# Patient Record
Sex: Male | Born: 2000 | Race: White | Hispanic: No | Marital: Single | Smoking: Never smoker
Health system: Southern US, Community
[De-identification: ages and names within clinical notes are randomized; demographics above are authoritative.]

## PROBLEM LIST (undated history)

## (undated) DIAGNOSIS — K219 Gastro-esophageal reflux disease without esophagitis: Secondary | ICD-10-CM

## (undated) DIAGNOSIS — J45909 Unspecified asthma, uncomplicated: Secondary | ICD-10-CM

---
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
## 2020-01-06 ENCOUNTER — Other Ambulatory Visit: Payer: Self-pay

## 2020-01-06 ENCOUNTER — Encounter: Payer: Self-pay | Admitting: Intensive Care

## 2020-01-06 ENCOUNTER — Emergency Department: Payer: Medicaid Other

## 2020-01-06 DIAGNOSIS — N50811 Right testicular pain: Secondary | ICD-10-CM | POA: Insufficient documentation

## 2020-01-06 DIAGNOSIS — Z5321 Procedure and treatment not carried out due to patient leaving prior to being seen by health care provider: Secondary | ICD-10-CM | POA: Insufficient documentation

## 2020-01-06 NOTE — ED Triage Notes (Signed)
Patient c/o intermittent testicle pain for months. Reports recently right side more painful, swollen, and seems "shorter." Denies trouble urinating

## 2020-01-07 ENCOUNTER — Emergency Department
Admission: EM | Admit: 2020-01-07 | Discharge: 2020-01-07 | Disposition: A | Discharge status: Home / Self Care | Payer: Medicaid Other | Attending: Emergency Medicine | Admitting: Emergency Medicine

## 2020-01-07 NOTE — ED Notes (Signed)
Called pt to re-check VS and collect labs. Unable to locate outside waiting room or in lobby.

## 2020-11-08 ENCOUNTER — Ambulatory Visit: Payer: Self-pay | Admitting: Urology

## 2021-04-12 ENCOUNTER — Ambulatory Visit
Admission: EM | Admit: 2021-04-12 | Discharge: 2021-04-12 | Disposition: A | Discharge status: Home / Self Care | Payer: Medicaid Other | Attending: Internal Medicine | Admitting: Internal Medicine

## 2021-04-12 ENCOUNTER — Other Ambulatory Visit: Payer: Self-pay

## 2021-04-12 DIAGNOSIS — R6889 Other general symptoms and signs: Secondary | ICD-10-CM | POA: Diagnosis not present

## 2021-04-12 NOTE — ED Triage Notes (Signed)
Pt here with C/O cough and chest congestion for 1 week. Was out with Flu like illness last week and needs to be cleared to go back to work.

## 2021-04-12 NOTE — ED Provider Notes (Signed)
[facility]    CSN: [id number] Arrival date & time: 04/12/21  1232      History   Chief Complaint Chief Complaint  Patient presents with   Cough    HPI Walter Fields is a 20 y.o. male comes to urgent care with 1 week history of chest congestion, nonproductive cough and nasal congestion.  Patient generalized body aches and a headache as well.  Patient has tried over-the-counter medications with no improvement in his symptoms.  He is coming to the urgent care for work note to return to work.Marland Kitchen   HPI  Past Medical History:  Diagnosis Date   Asthma in child    GERD (gastroesophageal reflux disease)     Patient Active Problem List   Diagnosis Date Noted   Appendicitis 04/11/2016   Acute appendicitis with localized peritonitis    Asthma 08/19/2013    Past Surgical History:  Procedure Laterality Date   LAPAROSCOPIC APPENDECTOMY N/A 04/11/2016   Procedure: APPENDECTOMY LAPAROSCOPIC;  Surgeon: Ricarda Frame, MD;  Location: [facility] ORS;  Service: General;  Laterality: N/A;       Home Medications    Prior to Admission medications   Medication Sig Start Date End Date Taking? Authorizing Provider  albuterol (VENTOLIN HFA) 108 (90 Base) MCG/ACT inhaler Inhale into the lungs. 01/19/10  Yes [provider]    Family History No family history on file.  Social History Social History   Tobacco Use   Smoking status: Never   Smokeless tobacco: Never  Vaping Use   Vaping Use: Every day  Substance Use Topics   Alcohol use: No   Drug use: No     Allergies   Patient has no known allergies.   Review of Systems Review of Systems As per HPI  Physical Exam Triage Vital Signs ED Triage Vitals  Enc Vitals Group     BP 04/12/21 1300 122/67     Pulse Rate 04/12/21 1300 87     Resp 04/12/21 1300 18     Temp 04/12/21 1300 99.1 F (37.3 C)     Temp Source 04/12/21 1300 Oral     SpO2 04/12/21 1300 98 %     Weight 04/12/21 1259 150 lb  (68 kg)     Height 04/12/21 1259 5\' 6"  (1.676 m)     Head Circumference --      Peak Flow --      Pain Score 04/12/21 1259 0     Pain Loc --      Pain Edu? --      Excl. in GC? --    No data found.  Updated Vital Signs BP 122/67 (BP Location: Right Arm)    Pulse 87    Temp 99.1 F (37.3 C) (Oral)    Resp 18    Ht 5\' 6"  (1.676 m)    Wt 68 kg    SpO2 98%    BMI 24.21 kg/m   Visual Acuity Right Eye Distance:   Left Eye Distance:   Bilateral Distance:    Right Eye Near:   Left Eye Near:    Bilateral Near:     Physical Exam Vitals and nursing note reviewed.  Constitutional:      General: He is not in acute distress.    Appearance: He is not ill-appearing.  Cardiovascular:     Rate and Rhythm: Normal rate and regular rhythm.     Pulses: Normal pulses.     Heart sounds: Normal heart sounds.  Pulmonary:     Effort: Pulmonary effort is normal.     Breath sounds: Normal breath sounds.  Neurological:     Mental Status: He is alert.     UC Treatments / Results  Labs (all labs ordered are listed, but only abnormal results are displayed) Labs Reviewed - No data to display  EKG   Radiology No results found.  Procedures Procedures (including critical care time)  Medications Ordered in UC Medications - No data to display  Initial Impression / Assessment and Plan / UC Course  I have reviewed the triage vital signs and the nursing notes.  Pertinent labs & imaging results that were available during my care of the patient were reviewed by me and considered in my medical decision making (see chart for details).     1.  Flulike symptoms-improved Patient is advised to maintain adequate hydration Continue over-the-counter remedies Return to urgent care if you have any other concerns Return to work note given. Final Clinical Impressions(s) / UC Diagnoses   Final diagnoses:  Flu-like symptoms     Discharge Instructions      Continue over-the-counter medication  for symptom management You may return to work today or tomorrow Return to urgent care if you have any other concerns.   ED Prescriptions   None    PDMP not reviewed this encounter.   Merrilee Jansky, MD 04/12/21 1318

## 2021-04-12 NOTE — Discharge Instructions (Addendum)
Continue over-the-counter medication for symptom management You may return to work today or tomorrow Return to urgent care if you have any other concerns.

## 2021-06-28 ENCOUNTER — Ambulatory Visit (INDEPENDENT_AMBULATORY_CARE_PROVIDER_SITE_OTHER): Payer: Medicaid Other

## 2021-06-28 ENCOUNTER — Ambulatory Visit
Admission: EM | Admit: 2021-06-28 | Discharge: 2021-06-28 | Disposition: A | Discharge status: Home / Self Care | Payer: Medicaid Other | Attending: Physician Assistant | Admitting: Physician Assistant

## 2021-06-28 ENCOUNTER — Other Ambulatory Visit: Payer: Self-pay

## 2021-06-28 DIAGNOSIS — M545 Low back pain, unspecified: Secondary | ICD-10-CM

## 2021-06-28 MED ORDER — DICLOFENAC SODIUM 75 MG PO TBEC: 75.0000 mg | DELAYED_RELEASE_TABLET | Freq: Two times a day (BID) | ORAL | 0 refills | Status: AC

## 2021-06-28 MED ORDER — CYCLOBENZAPRINE HCL 10 MG PO TABS: 10.0000 mg | ORAL_TABLET | Freq: Two times a day (BID) | ORAL | 0 refills | Status: AC | PRN

## 2021-06-28 NOTE — Discharge Instructions (Addendum)
BACK PAIN: x-rays are normal. You strained your back. Stressed avoiding painful activities . RICE (REST, ICE, COMPRESSION, ELEVATION) guidelines reviewed. May alternate ice and heat. Consider use of muscle rubs, Salonpas patches, etc. Use medications as directed including muscle relaxers if prescribed. Take anti-inflammatory medications as prescribed or OTC NSAIDs/Tylenol.  F/u with PCP in 7-10 days for reexamination, and please feel free to call or return to the urgent care at any time for any questions or concerns you may have and we will be happy to help you!  ? ?BACK PAIN RED FLAGS: If the back pain acutely worsens or there are any red flag symptoms such as numbness/tingling, leg weakness, saddle anesthesia, or loss of bowel/bladder control, go immediately to the ER. Follow up with [place] as scheduled or sooner if the pain does not begin to resolve or if it worsens before the follow up   ?

## 2021-06-28 NOTE — ED Provider Notes (Signed)
[facility]    CSN: [id number] Arrival date & time: 06/28/21  1127      History   Chief Complaint Chief Complaint  Patient presents with   Leg Pain   Back Pain    HPI Walter Fields is a 21 y.o. male presenting for diffuse aching lower back pain with radiation of pain into hips and thighs, especially the left hip and thigh for the past 2 days.  Pain is worse with forward flexion and prolonged sitting as well as lying down.  Denies any injury.  Has had similar symptoms in the past, about 6 months ago and then a few months before that.  Has never had any imaging.  No reports of numbness/tingling or weakness in the legs.  No reports of bowel or bladder dysfunction.  Patient says symptoms went away last time within 2 days.  He was apparently told he probably had a pinched nerve.  Patient does not recall taking any medication last time.  He has tried ibuprofen this time with the last dose sometime yesterday.  Has not taken anything for pain today.  Patient has no other complaints.  HPI  Past Medical History:  Diagnosis Date   Asthma in child    GERD (gastroesophageal reflux disease)     Patient Active Problem List   Diagnosis Date Noted   Appendicitis 04/11/2016   Acute appendicitis with localized peritonitis    Asthma 08/19/2013    Past Surgical History:  Procedure Laterality Date   LAPAROSCOPIC APPENDECTOMY N/A 04/11/2016   Procedure: APPENDECTOMY LAPAROSCOPIC;  Surgeon: Clayburn Pert, MD;  Location: [facility] ORS;  Service: General;  Laterality: N/A;       Home Medications    Prior to Admission medications   Medication Sig Start Date End Date Taking? Authorizing Provider  cyclobenzaprine (FLEXERIL) 10 MG tablet Take 1 tablet (10 mg total) by mouth 2 (two) times daily as needed for up to 7 days for muscle spasms. 06/28/21 07/05/21 Yes Danton Clap, PA-C  diclofenac (VOLTAREN) 75 MG EC tablet Take 1 tablet (75 mg total) by mouth 2 (two) times daily  for 10 days. 06/28/21 07/08/21 Yes Laurene Footman B, PA-C  albuterol (VENTOLIN HFA) 108 (90 Base) MCG/ACT inhaler Inhale into the lungs. 01/19/10   [provider]    Family History History reviewed. No pertinent family history.  Social History Social History   Tobacco Use   Smoking status: Never   Smokeless tobacco: Never  Vaping Use   Vaping Use: Every day  Substance Use Topics   Alcohol use: No   Drug use: No     Allergies   Patient has no known allergies.   Review of Systems Review of Systems  Constitutional:  Negative for fever.  Gastrointestinal:  Negative for abdominal pain.  Genitourinary:  Negative for flank pain.  Musculoskeletal:  Positive for back pain. Negative for gait problem and joint swelling.  Neurological:  Negative for weakness and numbness.    Physical Exam Triage Vital Signs ED Triage Vitals  Enc Vitals Group     BP 06/28/21 1139 (!) 108/56     Pulse Rate 06/28/21 1139 85     Resp --      Temp 06/28/21 1139 98.2 F (36.8 C)     Temp Source 06/28/21 1139 Oral     SpO2 06/28/21 1139 97 %     Weight 06/28/21 1135 155 lb (70.3 kg)     Height 06/28/21 1135 5' 6.5" (1.689 m)  Head Circumference --      Peak Flow --      Pain Score 06/28/21 1135 10     Pain Loc --      Pain Edu? --      Excl. in [place]? --    No data found.  Updated Vital Signs BP (!) 108/56 (BP Location: Left Arm)    Pulse 85    Temp 98.2 F (36.8 C) (Oral)    Ht 5' 6.5" (1.689 m)    Wt 155 lb (70.3 kg)    SpO2 97%    BMI 24.64 kg/m      Physical Exam Vitals and nursing note reviewed.  Constitutional:      General: He is not in acute distress.    Appearance: Normal appearance. He is well-developed.  HENT:     Head: Normocephalic and atraumatic.  Eyes:     General: No scleral icterus.    Conjunctiva/sclera: Conjunctivae normal.  Cardiovascular:     Rate and Rhythm: Normal rate and regular rhythm.  Pulmonary:     Effort: Pulmonary effort is normal. No  respiratory distress.     Breath sounds: Normal breath sounds.  Musculoskeletal:     Cervical back: Neck supple.     Lumbar back: Tenderness (Diffuse TTP of area in image,( L>R)) present. Decreased range of motion (Patient does not want to flex forward due to pain). Negative right straight leg raise test and negative left straight leg raise test.       Back:     Comments: 5/5 strength bilat LEs  Skin:    General: Skin is warm and dry.     Capillary Refill: Capillary refill takes less than 2 seconds.  Neurological:     General: No focal deficit present.     Mental Status: He is alert. Mental status is at baseline.     Motor: No weakness.     Coordination: Coordination normal.     Gait: Gait normal.  Psychiatric:        Mood and Affect: Mood normal.        Behavior: Behavior normal.        Thought Content: Thought content normal.     UC Treatments / Results  Labs (all labs ordered are listed, but only abnormal results are displayed) Labs Reviewed - No data to display  EKG   Radiology DG Lumbar Spine Complete  Result Date: 06/28/2021 CLINICAL DATA:  Chronic low back and left leg pain. No known injury. EXAM: LUMBAR SPINE - COMPLETE 4+ VIEW COMPARISON:  August 01, 2012. FINDINGS: There is no evidence of lumbar spine fracture. Alignment is normal. Intervertebral disc spaces are maintained. IMPRESSION: Negative. Electronically Signed   By: Marijo Conception M.D.   On: 06/28/2021 12:36    Procedures Procedures (including critical care time)  Medications Ordered in UC Medications - No data to display  Initial Impression / Assessment and Plan / UC Course  I have reviewed the triage vital signs and the nursing notes.  Pertinent labs & imaging results that were available during my care of the patient were reviewed by me and considered in my medical decision making (see chart for details).  21 year old male presenting for diffuse lower back pain for the past couple of days.  Has had  similar pains in the past 6 months ago and then a few months before that.  Pain does radiate into hips and thighs at times.  No red flag signs or symptoms mentioned.  Patient has diffuse tenderness throughout the lumbar region bilaterally, worse on the left.  Negative straight leg raise.  5 out of 5 strength bilateral lower extremities.  Normal gait.  X-ray of L-spine obtained today since he has never had an x-ray before and has had this complaint a few times.  Also offered him ketorolac injection in clinic for pain but he declines.  X-ray is negative.  Discussed this with patient.  Suspect muscle spasms and strain.  Patient does report lifting and bending a lot for his job.  Sent diclofenac sodium and cyclobenzaprine.  Also provided him with a work note for the next couple days.  Thoroughly reviewed ED red flag signs and symptoms.   Final Clinical Impressions(s) / UC Diagnoses   Final diagnoses:  Acute bilateral low back pain without sciatica     Discharge Instructions      BACK PAIN: x-rays are normal. You strained your back. Stressed avoiding painful activities . RICE (REST, ICE, COMPRESSION, ELEVATION) guidelines reviewed. May alternate ice and heat. Consider use of muscle rubs, Salonpas patches, etc. Use medications as directed including muscle relaxers if prescribed. Take anti-inflammatory medications as prescribed or OTC NSAIDs/Tylenol.  F/u with PCP in 7-10 days for reexamination, and please feel free to call or return to the urgent care at any time for any questions or concerns you may have and we will be happy to help you!   BACK PAIN RED FLAGS: If the back pain acutely worsens or there are any red flag symptoms such as numbness/tingling, leg weakness, saddle anesthesia, or loss of bowel/bladder control, go immediately to the ER. Follow up with [place] as scheduled or sooner if the pain does not begin to resolve or if it worsens before the follow up       ED Prescriptions      Medication Sig Dispense Auth. Provider   diclofenac (VOLTAREN) 75 MG EC tablet Take 1 tablet (75 mg total) by mouth 2 (two) times daily for 10 days. 20 tablet Laurene Footman B, PA-C   cyclobenzaprine (FLEXERIL) 10 MG tablet Take 1 tablet (10 mg total) by mouth 2 (two) times daily as needed for up to 7 days for muscle spasms. 14 tablet Danton Clap, PA-C      I have reviewed the PDMP during this encounter.   Danton Clap, PA-C 06/28/21 1255

## 2021-06-28 NOTE — ED Triage Notes (Signed)
Pt reports left sided low back and hip pain traveling down left leg.  ?

## 2023-04-28 IMAGING — CR DG LUMBAR SPINE COMPLETE 4+V
5 series · 5 of 5 positions shown · non-contrast
Comparison: August 01, 2012.

CLINICAL DATA: Chronic low back and left leg pain. No known injury.

EXAM:
LUMBAR SPINE - COMPLETE 4+ VIEW

[l-spine ap]
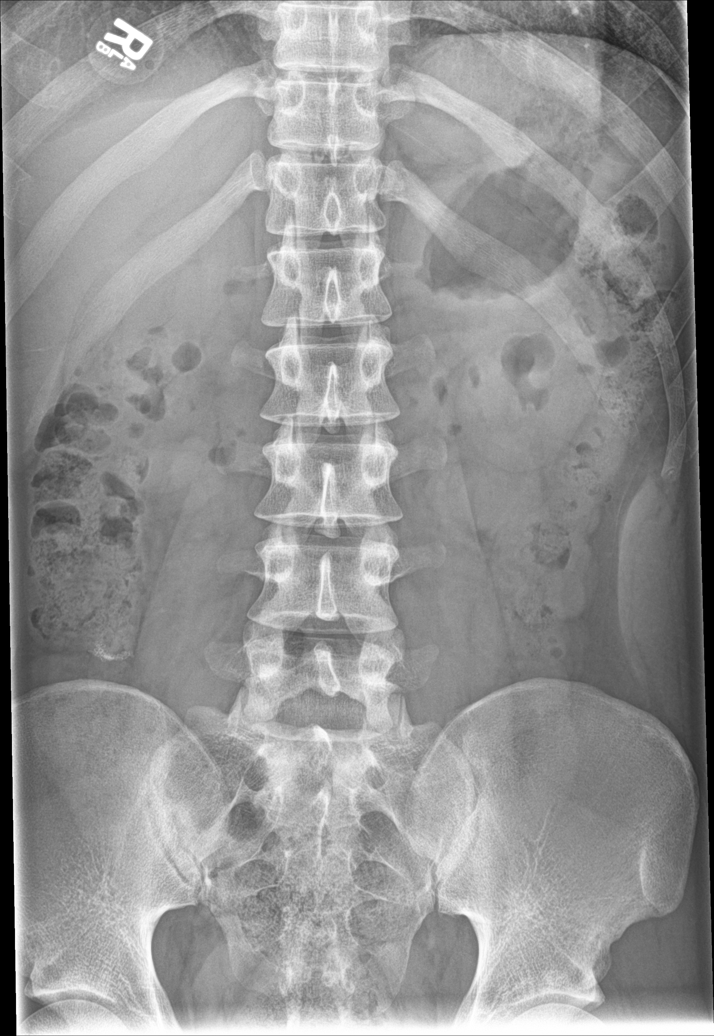

[l-spine obl (1 of 2)]
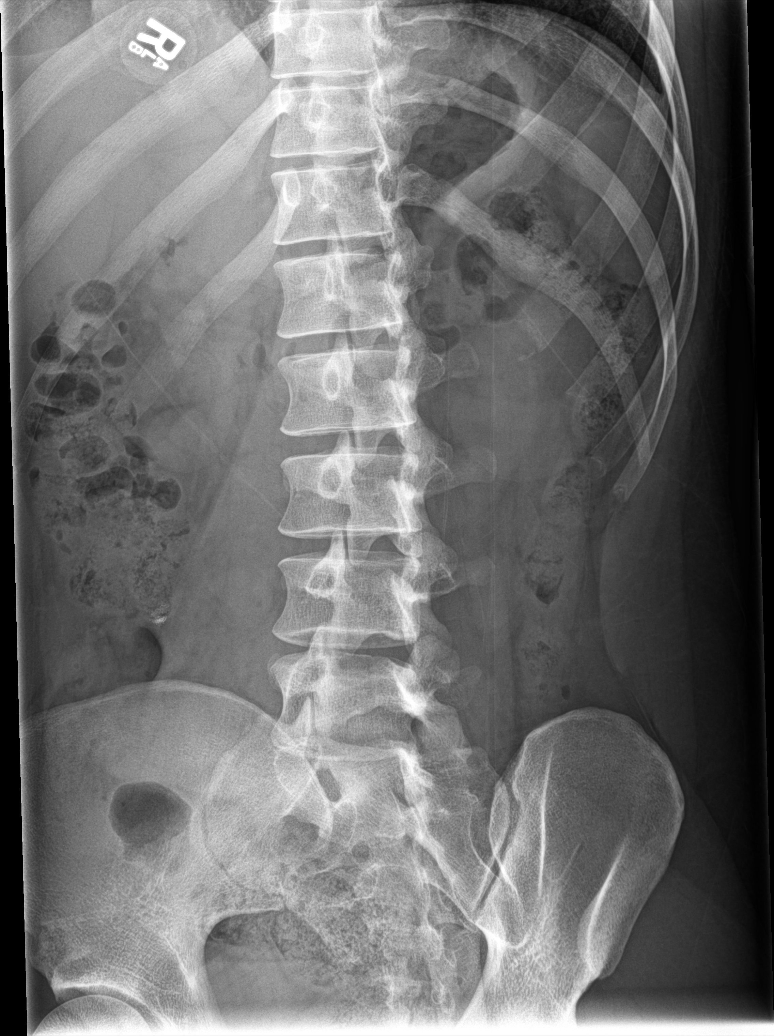

[l-spine obl (2 of 2)]
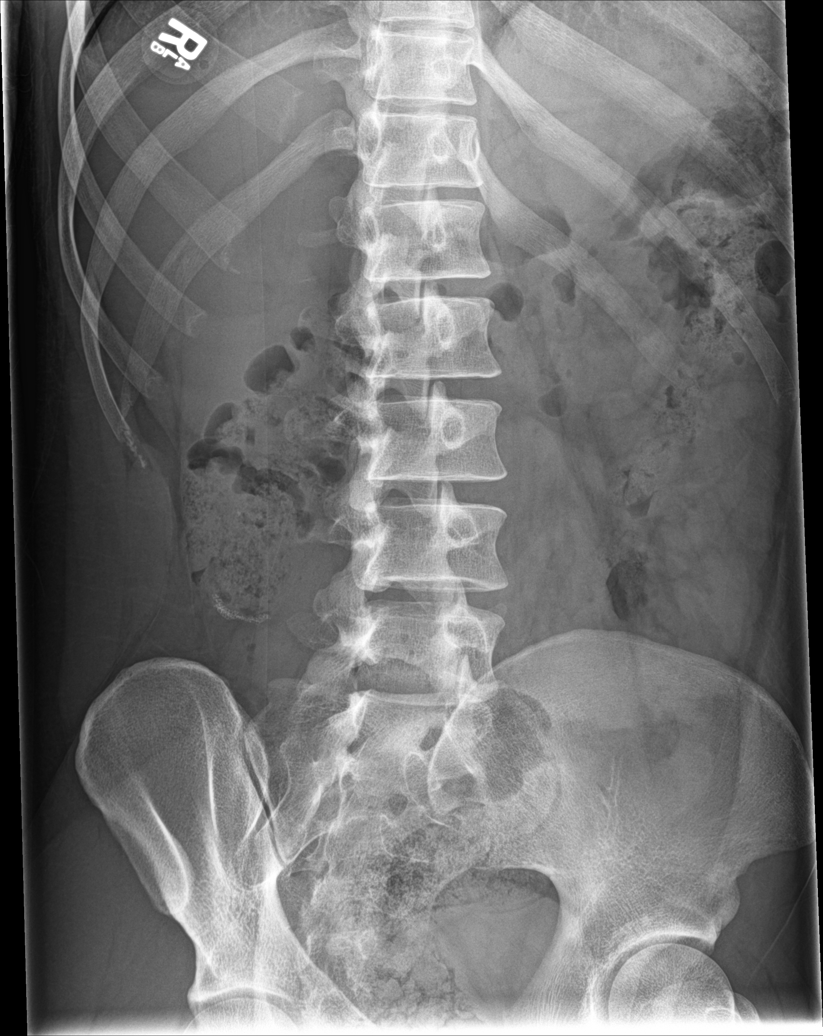

[l-spine lat]
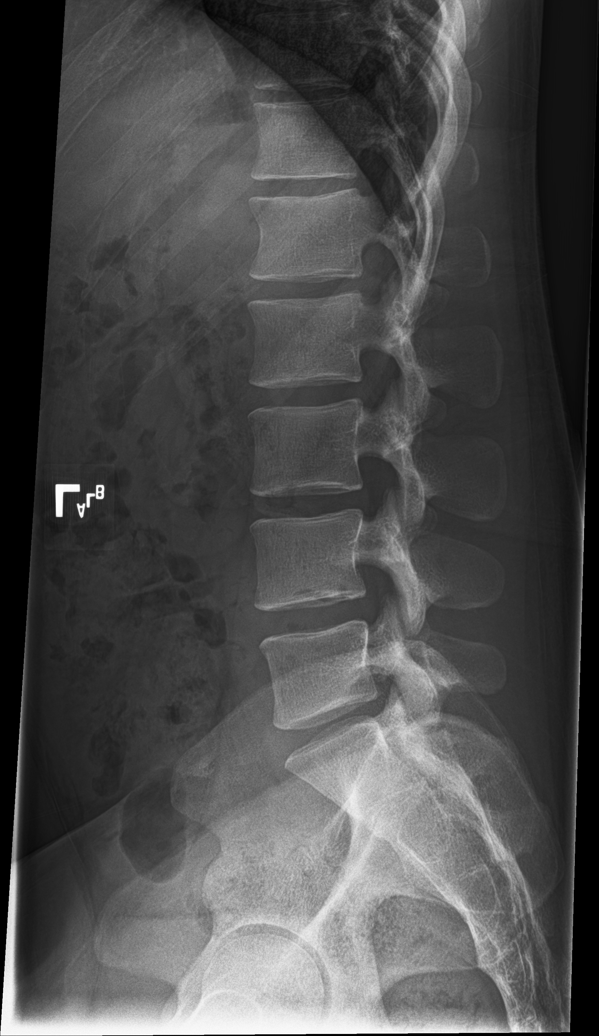

[l-spine spot]
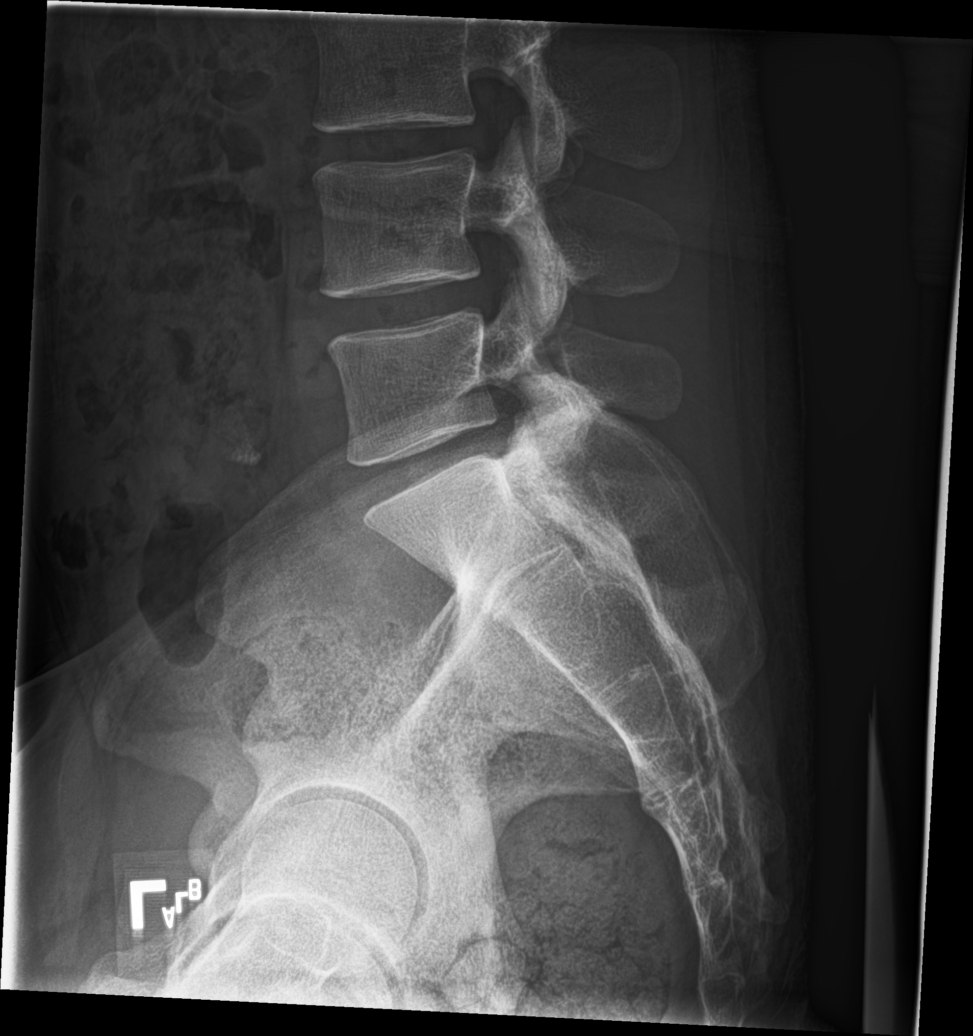

[5 of 5 positions shown; findings below may reference images not displayed]

FINDINGS: There is no evidence of lumbar spine fracture. Alignment is normal.
Intervertebral disc spaces are maintained.
IMPRESSION: Negative.

## 2023-05-22 ENCOUNTER — Ambulatory Visit
Admission: EM | Admit: 2023-05-22 | Discharge: 2023-05-22 | Disposition: A | Discharge status: Home / Self Care | Payer: Medicaid Other | Attending: Family Medicine | Admitting: Family Medicine

## 2023-05-22 ENCOUNTER — Encounter: Payer: Self-pay | Admitting: Emergency Medicine

## 2023-05-22 DIAGNOSIS — J101 Influenza due to other identified influenza virus with other respiratory manifestations: Secondary | ICD-10-CM

## 2023-05-22 LAB — RESP PANEL BY RT-PCR (FLU A&B, COVID) ARPGX2
Influenza A by PCR: POSITIVE — AB
Influenza B by PCR: NEGATIVE
SARS Coronavirus 2 by RT PCR: NEGATIVE

## 2023-05-22 MED ORDER — ALBUTEROL SULFATE HFA 108 (90 BASE) MCG/ACT IN AERS: 2.0000 | INHALATION_SPRAY | RESPIRATORY_TRACT | 0 refills | Status: AC | PRN

## 2023-05-22 NOTE — ED Triage Notes (Signed)
Patient presents with nasal congestion, productive cough, body aches and chills that started today. Patient states he took a dose day quill before he came in today.

## 2023-05-22 NOTE — Discharge Instructions (Addendum)
You have influenza A.  Your symptoms will gradually improve over the next 7 to 10 days.  The cough may last about 3 weeks.   Take ibuprofen 600 mg with Tylenol 1000 mg for fever, headache or body aches.   For cough: You can also use guaifenesin and dextromethorphan for cough. You can use a humidifier for chest congestion and cough.  If you don't have a humidifier, you can sit in the bathroom with the hot shower running.      For sore throat: try warm salt water gargles, Mucinex sore throat cough drops or cepacol lozenges, throat spray, warm tea or water with lemon/honey, popsicles or ice, or OTC cold relief medicine for throat discomfort. You can also purchase chloraseptic spray at the pharmacy or dollar store.   For congestion: take a daily anti-histamine like Zyrtec, Claritin, and a oral decongestant, such as pseudoephedrine.  You can also use Flonase 1-2 sprays in each nostril daily. Afrin is also a good option, if you do not have high blood pressure.    It is important to stay hydrated: drink plenty of fluids (water, gatorade/powerade/pedialyte, juices, or teas) to keep your throat moisturized and help further relieve irritation/discomfort.    Return or go to the Emergency Department if symptoms worsen or do not improve in the next few days

## 2023-05-22 NOTE — ED Provider Notes (Signed)
[facility]    CSN: [id number] Arrival date & time: 05/22/23  0954      History   Chief Complaint Chief Complaint  Patient presents with   Cough   Generalized Body Aches   Chills   Nasal Congestion    HPI Walter Fields is a 23 y.o. male.   HPI  History obtained from the patient. Walter Fields presents for headache, cough, chest congestion, chills, body aches, nasal congestion and fever that started this morning. Tmax 102 F.  Took Dayquil prior to arrival.  No sore throat, vomiting and diarrhea.  His girlfriend is sick with similar sx.      Past Medical History:  Diagnosis Date   Asthma in child    GERD (gastroesophageal reflux disease)     Patient Active Problem List   Diagnosis Date Noted   Appendicitis 04/11/2016   Acute appendicitis with localized peritonitis    Asthma 08/19/2013    Past Surgical History:  Procedure Laterality Date   LAPAROSCOPIC APPENDECTOMY N/A 04/11/2016   Procedure: APPENDECTOMY LAPAROSCOPIC;  Surgeon: Ricarda Frame, MD;  Location: [facility] ORS;  Service: General;  Laterality: N/A;       Home Medications    Prior to Admission medications   Medication Sig Start Date End Date Taking? Authorizing Provider  albuterol (VENTOLIN HFA) 108 (90 Base) MCG/ACT inhaler Inhale 2 puffs into the lungs every 4 (four) hours as needed. 05/22/23  Yes Ahna Konkle, Seward Meth, DO    Family History No family history on file.  Social History Social History   Tobacco Use   Smoking status: Never   Smokeless tobacco: Never  Vaping Use   Vaping status: Every Day  Substance Use Topics   Alcohol use: No   Drug use: No     Allergies   Patient has no known allergies.   Review of Systems Review of Systems: negative unless otherwise stated in HPI.      Physical Exam Triage Vital Signs ED Triage Vitals  Encounter Vitals Group     BP 05/22/23 1051 119/71     Systolic BP Percentile --      Diastolic BP Percentile --      Pulse  Rate 05/22/23 1051 89     Resp 05/22/23 1051 18     Temp 05/22/23 1051 100.2 F (37.9 C)     Temp Source 05/22/23 1051 Oral     SpO2 05/22/23 1051 95 %     Weight --      Height --      Head Circumference --      Peak Flow --      Pain Score 05/22/23 1055 8     Pain Loc --      Pain Education --      Exclude from Growth Chart --    No data found.  Updated Vital Signs BP 119/71 (BP Location: Right Arm)   Pulse 89   Temp 100.2 F (37.9 C) (Oral)   Resp 18   SpO2 95%   Visual Acuity Right Eye Distance:   Left Eye Distance:   Bilateral Distance:    Right Eye Near:   Left Eye Near:    Bilateral Near:     Physical Exam GEN:     alert, ill but non-toxic appearing male in no distress    HENT:  mucus membranes moist, oropharyngeal without lesions or erythema, no tonsillar hypertrophy or exudates, clear nasal discharge, EYES:   pupils equal and reactive, no scleral  injection or discharge NECK:  normal ROM, no lymphadenopathy, no meningismus   RESP:  no increased work of breathing, clear to auscultation bilaterally CVS:   regular rate and rhythm Skin:   warm and dry, no rash on visible skin    UC Treatments / Results  Labs (all labs ordered are listed, but only abnormal results are displayed) Labs Reviewed  RESP PANEL BY RT-PCR (FLU A&B, COVID) ARPGX2 - Abnormal; Notable for the following components:      Result Value   Influenza A by PCR POSITIVE (*)    All other components within normal limits    EKG   Radiology No results found.  Procedures Procedures (including critical care time)  Medications Ordered in UC Medications - No data to display  Initial Impression / Assessment and Plan / UC Course  I have reviewed the triage vital signs and the nursing notes.  Pertinent labs & imaging results that were available during my care of the patient were reviewed by me and considered in my medical decision making (see chart for details).       Pt is a 23 y.o.  male who presents for a few hours of influenza like symptoms. Walter Fields is afebrile here though had  recent antipyretics. Satting 95% on room air. Overall pt is ill but non-toxic appearing, well hydrated, without respiratory distress. Pulmonary exam is unremarkable.  COVID and influenza panel obtained and was influenza A positive.  After shared decision making he declined Tamiflu prescription.  Refilled albuterol inhaler as he has asthma.   Discussed symptomatic treatment.  Typical duration of symptoms discussed.  Work note provided.  Return and ED precautions given and voiced understanding. Discussed MDM, treatment plan and plan for follow-up with patient who agrees with plan.     Final Clinical Impressions(s) / UC Diagnoses   Final diagnoses:  Influenza A     Discharge Instructions      You have influenza A.  Your symptoms will gradually improve over the next 7 to 10 days.  The cough may last about 3 weeks.   Take ibuprofen 600 mg with Tylenol 1000 mg for fever, headache or body aches.   For cough: You can also use guaifenesin and dextromethorphan for cough. You can use a humidifier for chest congestion and cough.  If you don't have a humidifier, you can sit in the bathroom with the hot shower running.      For sore throat: try warm salt water gargles, Mucinex sore throat cough drops or cepacol lozenges, throat spray, warm tea or water with lemon/honey, popsicles or ice, or OTC cold relief medicine for throat discomfort. You can also purchase chloraseptic spray at the pharmacy or dollar store.   For congestion: take a daily anti-histamine like Zyrtec, Claritin, and a oral decongestant, such as pseudoephedrine.  You can also use Flonase 1-2 sprays in each nostril daily. Afrin is also a good option, if you do not have high blood pressure.    It is important to stay hydrated: drink plenty of fluids (water, gatorade/powerade/pedialyte, juices, or teas) to keep your throat moisturized and help  further relieve irritation/discomfort.    Return or go to the Emergency Department if symptoms worsen or do not improve in the next few days      ED Prescriptions     Medication Sig Dispense Auth. Provider   albuterol (VENTOLIN HFA) 108 (90 Base) MCG/ACT inhaler Inhale 2 puffs into the lungs every 4 (four) hours as needed. 6.7 g  Katha Cabal, DO      PDMP not reviewed this encounter.   Katha Cabal, DO 05/22/23 1151
